# Patient Record
Sex: Female | Born: 1945 | Race: White | Hispanic: No | Marital: Married | Smoking: Never smoker
Health system: Southern US, Community
[De-identification: ages and names within clinical notes are randomized; demographics above are authoritative.]

## PROBLEM LIST (undated history)

## (undated) DIAGNOSIS — R51 Headache: Secondary | ICD-10-CM

## (undated) DIAGNOSIS — R519 Headache, unspecified: Secondary | ICD-10-CM

## (undated) DIAGNOSIS — Z9289 Personal history of other medical treatment: Secondary | ICD-10-CM

## (undated) DIAGNOSIS — M1990 Unspecified osteoarthritis, unspecified site: Secondary | ICD-10-CM

## (undated) DIAGNOSIS — R011 Cardiac murmur, unspecified: Secondary | ICD-10-CM

## (undated) DIAGNOSIS — F419 Anxiety disorder, unspecified: Secondary | ICD-10-CM

## (undated) DIAGNOSIS — D649 Anemia, unspecified: Secondary | ICD-10-CM

## (undated) HISTORY — PX: TONSILLECTOMY: SUR1361

## (undated) HISTORY — PX: COSMETIC SURGERY: SHX468

---

## 2011-01-22 ENCOUNTER — Ambulatory Visit
Admission: RE | Admit: 2011-01-22 | Discharge: 2011-01-22 | Disposition: A | Discharge status: Home / Self Care | Payer: BC Managed Care – PPO | Source: Ambulatory Visit | Attending: Family Medicine | Admitting: Family Medicine

## 2011-01-22 ENCOUNTER — Other Ambulatory Visit: Payer: Self-pay | Admitting: Family Medicine

## 2011-01-22 DIAGNOSIS — R52 Pain, unspecified: Secondary | ICD-10-CM

## 2012-06-15 ENCOUNTER — Ambulatory Visit
Admission: RE | Admit: 2012-06-15 | Discharge: 2012-06-15 | Disposition: A | Discharge status: Home / Self Care | Payer: BC Managed Care – PPO | Source: Ambulatory Visit | Attending: Family Medicine | Admitting: Family Medicine

## 2012-06-15 ENCOUNTER — Other Ambulatory Visit: Payer: Self-pay | Admitting: Family Medicine

## 2012-06-15 DIAGNOSIS — R05 Cough: Secondary | ICD-10-CM

## 2012-06-15 DIAGNOSIS — R509 Fever, unspecified: Secondary | ICD-10-CM

## 2015-05-25 ENCOUNTER — Other Ambulatory Visit: Payer: Self-pay | Admitting: Family Medicine

## 2015-05-25 DIAGNOSIS — Z1231 Encounter for screening mammogram for malignant neoplasm of breast: Secondary | ICD-10-CM

## 2015-05-25 DIAGNOSIS — E2839 Other primary ovarian failure: Secondary | ICD-10-CM

## 2015-06-19 ENCOUNTER — Ambulatory Visit
Admission: RE | Admit: 2015-06-19 | Discharge: 2015-06-19 | Disposition: A | Discharge status: Home / Self Care | Payer: BC Managed Care – PPO | Source: Ambulatory Visit | Attending: Family Medicine | Admitting: Family Medicine

## 2015-06-19 DIAGNOSIS — Z1231 Encounter for screening mammogram for malignant neoplasm of breast: Secondary | ICD-10-CM

## 2015-06-19 DIAGNOSIS — E2839 Other primary ovarian failure: Secondary | ICD-10-CM

## 2016-07-30 ENCOUNTER — Other Ambulatory Visit: Payer: Self-pay | Admitting: Family Medicine

## 2016-07-30 DIAGNOSIS — Z1231 Encounter for screening mammogram for malignant neoplasm of breast: Secondary | ICD-10-CM

## 2016-08-29 ENCOUNTER — Ambulatory Visit
Admission: RE | Admit: 2016-08-29 | Discharge: 2016-08-29 | Disposition: A | Discharge status: Home / Self Care | Payer: BC Managed Care – PPO | Source: Ambulatory Visit | Attending: Family Medicine | Admitting: Family Medicine

## 2016-08-29 DIAGNOSIS — Z1231 Encounter for screening mammogram for malignant neoplasm of breast: Secondary | ICD-10-CM

## 2016-12-20 ENCOUNTER — Encounter (HOSPITAL_COMMUNITY): Payer: Self-pay | Admitting: Emergency Medicine

## 2016-12-20 ENCOUNTER — Emergency Department (HOSPITAL_COMMUNITY): Payer: BC Managed Care – PPO

## 2016-12-20 ENCOUNTER — Inpatient Hospital Stay (HOSPITAL_COMMUNITY)
Admission: EM | Admit: 2016-12-20 | Discharge: 2016-12-25 | DRG: 552 | Disposition: A | Discharge status: Skilled Nursing Facility | Payer: BC Managed Care – PPO | Attending: General Surgery | Admitting: General Surgery

## 2016-12-20 DIAGNOSIS — Y92410 Unspecified street and highway as the place of occurrence of the external cause: Secondary | ICD-10-CM

## 2016-12-20 DIAGNOSIS — S7001XA Contusion of right hip, initial encounter: Secondary | ICD-10-CM | POA: Diagnosis present

## 2016-12-20 DIAGNOSIS — M1990 Unspecified osteoarthritis, unspecified site: Secondary | ICD-10-CM | POA: Diagnosis present

## 2016-12-20 DIAGNOSIS — S92331A Displaced fracture of third metatarsal bone, right foot, initial encounter for closed fracture: Secondary | ICD-10-CM | POA: Diagnosis not present

## 2016-12-20 DIAGNOSIS — S7002XA Contusion of left hip, initial encounter: Secondary | ICD-10-CM | POA: Diagnosis present

## 2016-12-20 DIAGNOSIS — Z885 Allergy status to narcotic agent status: Secondary | ICD-10-CM | POA: Diagnosis not present

## 2016-12-20 DIAGNOSIS — M542 Cervicalgia: Secondary | ICD-10-CM | POA: Diagnosis not present

## 2016-12-20 DIAGNOSIS — S92321A Displaced fracture of second metatarsal bone, right foot, initial encounter for closed fracture: Secondary | ICD-10-CM | POA: Diagnosis not present

## 2016-12-20 DIAGNOSIS — S22019A Unspecified fracture of first thoracic vertebra, initial encounter for closed fracture: Secondary | ICD-10-CM | POA: Diagnosis present

## 2016-12-20 DIAGNOSIS — S22018A Other fracture of first thoracic vertebra, initial encounter for closed fracture: Secondary | ICD-10-CM

## 2016-12-20 DIAGNOSIS — S12600A Unspecified displaced fracture of seventh cervical vertebra, initial encounter for closed fracture: Principal | ICD-10-CM | POA: Diagnosis present

## 2016-12-20 DIAGNOSIS — S2220XA Unspecified fracture of sternum, initial encounter for closed fracture: Secondary | ICD-10-CM | POA: Diagnosis present

## 2016-12-20 DIAGNOSIS — Z888 Allergy status to other drugs, medicaments and biological substances status: Secondary | ICD-10-CM

## 2016-12-20 DIAGNOSIS — S12601A Unspecified nondisplaced fracture of seventh cervical vertebra, initial encounter for closed fracture: Secondary | ICD-10-CM

## 2016-12-20 HISTORY — DX: Unspecified osteoarthritis, unspecified site: M19.90

## 2016-12-20 HISTORY — DX: Anemia, unspecified: D64.9

## 2016-12-20 HISTORY — DX: Cardiac murmur, unspecified: R01.1

## 2016-12-20 HISTORY — DX: Headache: R51

## 2016-12-20 HISTORY — DX: Anxiety disorder, unspecified: F41.9

## 2016-12-20 HISTORY — DX: Personal history of other medical treatment: Z92.89

## 2016-12-20 HISTORY — DX: Headache, unspecified: R51.9

## 2016-12-20 LAB — I-STAT CHEM 8, ED
BUN: 14 mg/dL (ref 6–20)
CHLORIDE: 104 mmol/L (ref 101–111)
Calcium, Ion: 1.15 mmol/L (ref 1.15–1.40)
Creatinine, Ser: 0.7 mg/dL (ref 0.44–1.00)
GLUCOSE: 200 mg/dL — AB (ref 65–99)
HEMATOCRIT: 37 % (ref 36.0–46.0)
Hemoglobin: 12.6 g/dL (ref 12.0–15.0)
POTASSIUM: 3.6 mmol/L (ref 3.5–5.1)
Sodium: 140 mmol/L (ref 135–145)
TCO2: 25 mmol/L (ref 0–100)

## 2016-12-20 LAB — COMPREHENSIVE METABOLIC PANEL
ALT: 28 U/L (ref 14–54)
AST: 47 U/L — ABNORMAL HIGH (ref 15–41)
Albumin: 3.6 g/dL (ref 3.5–5.0)
Alkaline Phosphatase: 70 U/L (ref 38–126)
Anion gap: 12 (ref 5–15)
BILIRUBIN TOTAL: 0.7 mg/dL (ref 0.3–1.2)
BUN: 13 mg/dL (ref 6–20)
CO2: 24 mmol/L (ref 22–32)
Calcium: 9.5 mg/dL (ref 8.9–10.3)
Chloride: 104 mmol/L (ref 101–111)
Creatinine, Ser: 0.74 mg/dL (ref 0.44–1.00)
Glucose, Bld: 203 mg/dL — ABNORMAL HIGH (ref 65–99)
Potassium: 3.6 mmol/L (ref 3.5–5.1)
Sodium: 140 mmol/L (ref 135–145)
TOTAL PROTEIN: 6.3 g/dL — AB (ref 6.5–8.1)

## 2016-12-20 LAB — CBC WITH DIFFERENTIAL/PLATELET
BASOS ABS: 0 10*3/uL (ref 0.0–0.1)
Basophils Relative: 0 %
Eosinophils Absolute: 0.1 10*3/uL (ref 0.0–0.7)
Eosinophils Relative: 1 %
HEMATOCRIT: 36.5 % (ref 36.0–46.0)
Hemoglobin: 12 g/dL (ref 12.0–15.0)
LYMPHS ABS: 1.3 10*3/uL (ref 0.7–4.0)
LYMPHS PCT: 11 %
MCH: 31.5 pg (ref 26.0–34.0)
MCHC: 32.9 g/dL (ref 30.0–36.0)
MCV: 95.8 fL (ref 78.0–100.0)
MONO ABS: 0.8 10*3/uL (ref 0.1–1.0)
Monocytes Relative: 7 %
NEUTROS ABS: 10 10*3/uL — AB (ref 1.7–7.7)
Neutrophils Relative %: 81 %
PLATELETS: 291 10*3/uL (ref 150–400)
RBC: 3.81 MIL/uL — ABNORMAL LOW (ref 3.87–5.11)
RDW: 14.3 % (ref 11.5–15.5)
WBC: 12.3 10*3/uL — ABNORMAL HIGH (ref 4.0–10.5)

## 2016-12-20 LAB — I-STAT TROPONIN, ED: TROPONIN I, POC: 0 ng/mL (ref 0.00–0.08)

## 2016-12-20 LAB — LIPASE, BLOOD: LIPASE: 20 U/L (ref 11–51)

## 2016-12-20 MED ORDER — ONDANSETRON HCL 4 MG/2ML IJ SOLN
4.0000 mg | Freq: Once | INTRAMUSCULAR | Status: AC
  Administered 2016-12-20: 4 mg via INTRAVENOUS
  Filled 2016-12-20: qty 2

## 2016-12-20 MED ORDER — SODIUM CHLORIDE 0.9 % IV BOLUS (SEPSIS)
1000.0000 mL | Freq: Once | INTRAVENOUS | Status: AC
  Administered 2016-12-20: 1000 mL via INTRAVENOUS

## 2016-12-20 MED ORDER — ENOXAPARIN SODIUM 40 MG/0.4ML ~~LOC~~ SOLN
40.0000 mg | SUBCUTANEOUS | Status: DC
  Administered 2016-12-21 – 2016-12-25 (×5): 40 mg via SUBCUTANEOUS
  Filled 2016-12-20 (×5): qty 0.4

## 2016-12-20 MED ORDER — IOPAMIDOL (ISOVUE-300) INJECTION 61%
INTRAVENOUS | Status: AC
  Administered 2016-12-20: 100 mL
  Filled 2016-12-20: qty 100

## 2016-12-20 MED ORDER — DIPHENHYDRAMINE HCL 25 MG PO TABS
25.0000 mg | ORAL_TABLET | Freq: Four times a day (QID) | ORAL | Status: DC | PRN
  Filled 2016-12-20: qty 1

## 2016-12-20 MED ORDER — KCL IN DEXTROSE-NACL 20-5-0.45 MEQ/L-%-% IV SOLN
INTRAVENOUS | Status: DC
  Administered 2016-12-20 – 2016-12-21 (×2): via INTRAVENOUS
  Filled 2016-12-20 (×2): qty 1000

## 2016-12-20 MED ORDER — ONDANSETRON HCL 4 MG PO TABS: 4.0000 mg | ORAL_TABLET | Freq: Four times a day (QID) | ORAL | Status: DC | PRN

## 2016-12-20 MED ORDER — METHOTREXATE 2.5 MG PO TABS
10.0000 mg | ORAL_TABLET | ORAL | Status: DC
  Filled 2016-12-20: qty 4

## 2016-12-20 MED ORDER — HYDROMORPHONE HCL 1 MG/ML IJ SOLN
0.5000 mg | Freq: Once | INTRAMUSCULAR | Status: AC
  Administered 2016-12-20: 0.5 mg via INTRAVENOUS
  Filled 2016-12-20: qty 1

## 2016-12-20 MED ORDER — METHOCARBAMOL 1000 MG/10ML IJ SOLN
1000.0000 mg | Freq: Three times a day (TID) | INTRAVENOUS | Status: DC | PRN
  Administered 2016-12-20 – 2016-12-23 (×6): 1000 mg via INTRAVENOUS
  Filled 2016-12-20 (×13): qty 10

## 2016-12-20 MED ORDER — OXYCODONE HCL 5 MG PO TABS
10.0000 mg | ORAL_TABLET | ORAL | Status: DC | PRN
  Administered 2016-12-22 – 2016-12-24 (×5): 10 mg via ORAL
  Filled 2016-12-20 (×7): qty 2

## 2016-12-20 MED ORDER — ONDANSETRON HCL 4 MG/2ML IJ SOLN
4.0000 mg | Freq: Four times a day (QID) | INTRAMUSCULAR | Status: DC | PRN
  Administered 2016-12-20 – 2016-12-21 (×6): 4 mg via INTRAVENOUS
  Filled 2016-12-20 (×6): qty 2

## 2016-12-20 MED ORDER — PANTOPRAZOLE SODIUM 40 MG PO TBEC
40.0000 mg | DELAYED_RELEASE_TABLET | Freq: Every day | ORAL | Status: DC
  Administered 2016-12-22 – 2016-12-25 (×4): 40 mg via ORAL
  Filled 2016-12-20 (×4): qty 1

## 2016-12-20 MED ORDER — PANTOPRAZOLE SODIUM 40 MG IV SOLR
40.0000 mg | Freq: Every day | INTRAVENOUS | Status: DC
  Administered 2016-12-20 – 2016-12-21 (×2): 40 mg via INTRAVENOUS
  Filled 2016-12-20 (×2): qty 40

## 2016-12-20 MED ORDER — OXYCODONE HCL 5 MG PO TABS
5.0000 mg | ORAL_TABLET | ORAL | Status: DC | PRN
  Administered 2016-12-24: 5 mg via ORAL

## 2016-12-20 MED ORDER — HYDROMORPHONE HCL 1 MG/ML IJ SOLN
0.5000 mg | INTRAMUSCULAR | Status: DC | PRN
  Administered 2016-12-20 – 2016-12-23 (×15): 0.5 mg via INTRAVENOUS
  Filled 2016-12-20 (×15): qty 1

## 2016-12-20 MED ORDER — TRAMADOL HCL 50 MG PO TABS: 50.0000 mg | ORAL_TABLET | Freq: Four times a day (QID) | ORAL | Status: DC | PRN

## 2016-12-20 NOTE — Progress Notes (Signed)
Orthopedic Tech Progress Note Patient Details:  Judy Waters 1946/02/04 [id number]  Ortho Devices Type of Ortho Device: CAM walker Ortho Device/Splint Location: rle Ortho Device/Splint Interventions: Application   [name], [name] 12/20/2016, 4:51 PM

## 2016-12-20 NOTE — ED Notes (Signed)
Returned from ct scan 
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
## 2016-12-20 NOTE — Progress Notes (Signed)
Agree with previous RN's assessment. 
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
## 2016-12-20 NOTE — Progress Notes (Signed)
Orthopedic Tech Progress Note Patient Details:  Judy Waters [birth year] [id number]  Ortho Devices Type of Ortho Device: Ace wrap, Post (short leg) splint Ortho Device/Splint Location: rle Ortho Device/Splint Interventions: Application   Julius Matus 12/20/2016, 11:10 AM

## 2016-12-20 NOTE — ED Notes (Signed)
Patient transported to CT 
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
## 2016-12-20 NOTE — ED Notes (Signed)
Paged Ortho to place in splint

## 2016-12-21 LAB — BASIC METABOLIC PANEL
Anion gap: 6 (ref 5–15)
BUN: 7 mg/dL (ref 6–20)
CHLORIDE: 105 mmol/L (ref 101–111)
CO2: 26 mmol/L (ref 22–32)
Calcium: 8.7 mg/dL — ABNORMAL LOW (ref 8.9–10.3)
Creatinine, Ser: 0.61 mg/dL (ref 0.44–1.00)
GFR calc Af Amer: >60 mL/min (ref >60)
GLUCOSE: 120 mg/dL — AB (ref 65–99)
POTASSIUM: 3.8 mmol/L (ref 3.5–5.1)
Sodium: 137 mmol/L (ref 135–145)

## 2016-12-21 LAB — CBC
HEMATOCRIT: 31.3 % — AB (ref 36.0–46.0)
Hemoglobin: 10.2 g/dL — ABNORMAL LOW (ref 12.0–15.0)
MCH: 31.1 pg (ref 26.0–34.0)
MCHC: 32.6 g/dL (ref 30.0–36.0)
MCV: 95.4 fL (ref 78.0–100.0)
PLATELETS: 247 10*3/uL (ref 150–400)
RBC: 3.28 MIL/uL — ABNORMAL LOW (ref 3.87–5.11)
RDW: 14.4 % (ref 11.5–15.5)
WBC: 8.4 10*3/uL (ref 4.0–10.5)

## 2016-12-21 MED ORDER — SODIUM CHLORIDE 0.9% FLUSH
3.0000 mL | Freq: Two times a day (BID) | INTRAVENOUS | Status: DC
  Administered 2016-12-23 – 2016-12-24 (×2): 3 mL via INTRAVENOUS

## 2016-12-21 MED ORDER — SODIUM CHLORIDE 0.9 % IV SOLN: 250.0000 mL | INTRAVENOUS | Status: DC | PRN

## 2016-12-21 MED ORDER — SODIUM CHLORIDE 0.9% FLUSH: 3.0000 mL | INTRAVENOUS | Status: DC | PRN
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
## 2016-12-21 NOTE — Progress Notes (Signed)
Pt refuses to take PO pain meds for she scared to get upset stomach. Refuses to use ice pack for ice pack is too bulky and heavy. Offered hot pack for some pain relief but refuses because "its too small" and "it does not work". Pt only takes IV dilaudid pain medicine if PRN zofran is available. Tried to reposition pt side to side for comfort but refused to turn on her right side because of the glare/light from the computer and window gives her headache. Gave robaxin IV PRN for muscle pains. Will continue to monitor pt.

## 2016-12-22 MED ORDER — DOCUSATE SODIUM 100 MG PO CAPS
100.0000 mg | ORAL_CAPSULE | Freq: Two times a day (BID) | ORAL | Status: DC
  Administered 2016-12-22 – 2016-12-25 (×7): 100 mg via ORAL
  Filled 2016-12-22 (×7): qty 1

## 2016-12-22 MED ORDER — ONDANSETRON HCL 4 MG PO TABS
4.0000 mg | ORAL_TABLET | ORAL | Status: DC | PRN
  Administered 2016-12-22 – 2016-12-24 (×3): 4 mg via ORAL
  Filled 2016-12-22 (×3): qty 1

## 2016-12-22 MED ORDER — TRAMADOL HCL 50 MG PO TABS
50.0000 mg | ORAL_TABLET | Freq: Two times a day (BID) | ORAL | Status: DC
  Administered 2016-12-22 – 2016-12-25 (×6): 50 mg via ORAL
  Filled 2016-12-22 (×8): qty 1

## 2016-12-22 MED ORDER — ACETAMINOPHEN 325 MG PO TABS: 650.0000 mg | ORAL_TABLET | Freq: Four times a day (QID) | ORAL | Status: DC | PRN

## 2016-12-22 MED ORDER — ONDANSETRON HCL 4 MG/2ML IJ SOLN
4.0000 mg | INTRAMUSCULAR | Status: DC | PRN
  Administered 2016-12-22 – 2016-12-24 (×7): 4 mg via INTRAVENOUS
  Filled 2016-12-22 (×7): qty 2
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
## 2016-12-22 NOTE — Clinical Social Work Placement (Signed)
   CLINICAL SOCIAL WORK PLACEMENT  NOTE  Date:  12/22/2016  Patient Details  Name: Baltazar Apoamela Kott MRN: [id number] Date of Birth: 1946-04-19  Clinical Social Work is seeking post-discharge placement for this patient at the Skilled  Nursing Facility level of care (*CSW will initial, date and re-position this form in  chart as items are completed):  No   Patient/family provided with [facility] Clinical Social Work Department's list of facilities offering this level of care within the geographic area requested by the patient (or if unable, by the patient's family).  Yes   Patient/family informed of their freedom to choose among providers that offer the needed level of care, that participate in Medicare, Medicaid or managed care program needed by the patient, have an available bed and are willing to accept the patient.  Yes   Patient/family informed of [facility]'s ownership interest in [facility] and [facility], as well as of the fact that they are under no obligation to receive care at these facilities.  PASRR submitted to EDS on       PASRR number received on       Existing PASRR number confirmed on       FL2 transmitted to all facilities in geographic area requested by pt/family on       FL2 transmitted to all facilities within larger geographic area on       Patient informed that his/her managed care company has contracts with or will negotiate with certain facilities, including the following:            Patient/family informed of bed offers received.  Patient chooses bed at       Physician recommends and patient chooses bed at      Patient to be transferred to   on  .  Patient to be transferred to facility by       Patient family notified on   of transfer.  Name of family member notified:        PHYSICIAN Please sign FL2     Additional Comment:    _______________________________________________ Althea CharonAshley C Lydia Meng, LCSW 12/22/2016, 4:26 PM
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
## 2016-12-22 NOTE — Progress Notes (Addendum)
IV became occluded after IV pain/muscle relaxer. IV team notified for new IV  Site.. PO pain meds given in the mean time. Will continue the muscle relaxer once we have IV access.
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
## 2016-12-23 MED ORDER — PROMETHAZINE HCL 25 MG PO TABS
12.5000 mg | ORAL_TABLET | Freq: Four times a day (QID) | ORAL | Status: DC | PRN
  Administered 2016-12-23 (×2): 12.5 mg via ORAL
  Filled 2016-12-23 (×2): qty 1

## 2016-12-23 MED ORDER — METHOCARBAMOL 500 MG PO TABS: 1000.0000 mg | ORAL_TABLET | Freq: Three times a day (TID) | ORAL | Status: DC | PRN

## 2016-12-23 MED ORDER — ACETAMINOPHEN 500 MG PO TABS
500.0000 mg | ORAL_TABLET | Freq: Four times a day (QID) | ORAL | Status: DC
  Administered 2016-12-23 – 2016-12-25 (×9): 500 mg via ORAL
  Filled 2016-12-23 (×9): qty 1

## 2016-12-23 MED ORDER — METHOCARBAMOL 750 MG PO TABS
750.0000 mg | ORAL_TABLET | Freq: Three times a day (TID) | ORAL | Status: DC | PRN
  Administered 2016-12-23 – 2016-12-25 (×4): 750 mg via ORAL
  Filled 2016-12-23 (×4): qty 1

## 2016-12-23 MED ORDER — IBUPROFEN 400 MG PO TABS
400.0000 mg | ORAL_TABLET | Freq: Four times a day (QID) | ORAL | Status: DC
  Administered 2016-12-23 – 2016-12-25 (×9): 400 mg via ORAL
  Filled 2016-12-23 (×9): qty 1

## 2016-12-23 MED ORDER — HYDROMORPHONE HCL 1 MG/ML IJ SOLN
0.5000 mg | INTRAMUSCULAR | Status: DC | PRN
  Administered 2016-12-23 – 2016-12-24 (×5): 0.5 mg via INTRAVENOUS
  Filled 2016-12-23 (×5): qty 1
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
## 2016-12-23 NOTE — Progress Notes (Signed)
Mobility Tech check on patient twice and each time the patient was in pain, RN recommend that the mobility tech just come back tomorrow.  Vashti Heyamekia Jameyah Fen[place]ald, [place] [phone]
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
## 2016-12-23 NOTE — Clinical Social Work Note (Signed)
Clinical Social Worker continuing to follow patient and family for support and discharge planning needs.  CSW spoke with patient at bedside who requests placement at [facility] - CSW relayed to patient that unfortunately facility is not in network with insurance.  Patient is agreeable to [facility] as it is also close to home.  CSW has initiated referral and will follow up with patient once bed offer is available.  CSW remains available for support and to facilitate patient discharge needs.  Judy GoldsJesse Lacheryl Waters, [place] [phone]
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
## 2016-12-24 LAB — CBC
HCT: 31.3 % — ABNORMAL LOW (ref 36.0–46.0)
Hemoglobin: 10.5 g/dL — ABNORMAL LOW (ref 12.0–15.0)
MCH: 32.3 pg (ref 26.0–34.0)
MCHC: 33.5 g/dL (ref 30.0–36.0)
MCV: 96.3 fL (ref 78.0–100.0)
PLATELETS: 256 10*3/uL (ref 150–400)
RBC: 3.25 MIL/uL — AB (ref 3.87–5.11)
RDW: 14.7 % (ref 11.5–15.5)
WBC: 6.6 10*3/uL (ref 4.0–10.5)

## 2016-12-24 MED ORDER — PROMETHAZINE HCL 12.5 MG PO TABS: 12.5000 mg | ORAL_TABLET | Freq: Four times a day (QID) | ORAL | 0 refills | Status: AC | PRN

## 2016-12-24 MED ORDER — PANTOPRAZOLE SODIUM 40 MG PO TBEC: 40.0000 mg | DELAYED_RELEASE_TABLET | Freq: Every day | ORAL | Status: AC

## 2016-12-24 MED ORDER — TRAMADOL HCL 50 MG PO TABS: 50.0000 mg | ORAL_TABLET | Freq: Two times a day (BID) | ORAL | 0 refills | Status: AC

## 2016-12-24 MED ORDER — IBUPROFEN 400 MG PO TABS: 400.0000 mg | ORAL_TABLET | Freq: Four times a day (QID) | ORAL | 0 refills | Status: AC

## 2016-12-24 MED ORDER — ONDANSETRON HCL 4 MG PO TABS: 4.0000 mg | ORAL_TABLET | ORAL | 0 refills | Status: AC | PRN

## 2016-12-24 MED ORDER — ACETAMINOPHEN 500 MG PO TABS: 500.0000 mg | ORAL_TABLET | Freq: Four times a day (QID) | ORAL | 0 refills | Status: AC | PRN

## 2016-12-24 MED ORDER — OXYCODONE HCL 5 MG PO TABS: 5.0000 mg | ORAL_TABLET | ORAL | 0 refills | Status: AC | PRN

## 2016-12-24 MED ORDER — METHOCARBAMOL 750 MG PO TABS: 750.0000 mg | ORAL_TABLET | Freq: Three times a day (TID) | ORAL | 0 refills | Status: AC | PRN

## 2016-12-24 MED ORDER — DOCUSATE SODIUM 100 MG PO CAPS: 100.0000 mg | ORAL_CAPSULE | Freq: Two times a day (BID) | ORAL | 0 refills | Status: AC
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
## 2016-12-24 NOTE — Clinical Social Work Note (Signed)
Clinical Social Worker continuing to follow patient and family for support and discharge planning needs.  CSW spoke with patient who is agreeable with bed offer from [facility].  CSW spoke with facility who states that they have submitted for insurance authorization and will likely hear back by the end of today.  Per PA, plan is for patient discharge tomorrow to facility.  CSW spoke with patient who is aware and will have family bring necessary things to the hospital this evening.  CSW remains available for support and to facilitate patient discharge needs.  Macario GoldsJesse Emonie Espericueta, [place] [phone]
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
## 2016-12-25 NOTE — Progress Notes (Signed)
Report called to [facility]. Gave RN my number for any questions/concerns.

## 2016-12-25 NOTE — Progress Notes (Signed)
Pt. Dressed and ready to go. Boot on Right foot. C-collar on. Pt. Belongings gathered and sent with pt. IV out. D/c papers and prescriptions sent with pt.  Pt. [facility] to [facility].

## 2016-12-25 NOTE — Clinical Social Work Placement (Signed)
   CLINICAL SOCIAL WORK PLACEMENT  NOTE  Date:  12/25/2016  Patient Details  Name: Judy Waters MRN: [id number] Date of Birth: 24-Sep-1946  Clinical Social Work is seeking post-discharge placement for this patient at the Skilled  Nursing Facility level of care (*CSW will initial, date and re-position this form in  chart as items are completed):  No   Patient/family provided with [facility] Clinical Social Work Department's list of facilities offering this level of care within the geographic area requested by the patient (or if unable, by the patient's family).  Yes   Patient/family informed of their freedom to choose among providers that offer the needed level of care, that participate in Medicare, Medicaid or managed care program needed by the patient, have an available bed and are willing to accept the patient.  Yes   Patient/family informed of [facility]'s ownership interest in [facility] and [facility], as well as of the fact that they are under no obligation to receive care at these facilities.  PASRR submitted to EDS on       PASRR number received on 12/22/16     Existing PASRR number confirmed on       FL2 transmitted to all facilities in geographic area requested by pt/family on 12/22/16     FL2 transmitted to all facilities within larger geographic area on       Patient informed that his/her managed care company has contracts with or will negotiate with certain facilities, including the following:        Yes   Patient/family informed of bed offers received.  Patient [facility]     Physician recommends and patient chooses bed at      [facility] 12[facility].  Patient to be transferred to facility by [facility]     Patient family notified on 12/25/16 of transfer.  Name of family member notified:  Birdena Jubileeavid Nile     PHYSICIAN       Additional Comment:  Pt [facility]. Pt is aware and agreeable to d/c plan. Pt declined CSW updating family. Pt will call son herself. CSW [facility] and confirmed room and report with Olegario MessierKathy (admissions) at [facility]. CSW provided RN with room and report and put in treatment team sticky note. CSW arranged transportation with [facility] for11:30am pickup, as requested by RN. CSW signing off, as no further needs identified.   _______________________________________________ Dominic PeaJeneya G Yousuf Ager, LCSW 12/25/2016, 10:25 AM

## 2018-04-21 ENCOUNTER — Other Ambulatory Visit: Payer: Self-pay | Admitting: Family Medicine

## 2018-04-21 DIAGNOSIS — Z1231 Encounter for screening mammogram for malignant neoplasm of breast: Secondary | ICD-10-CM

## 2018-07-09 ENCOUNTER — Ambulatory Visit
Admission: RE | Admit: 2018-07-09 | Discharge: 2018-07-09 | Disposition: A | Discharge status: Home / Self Care | Payer: BC Managed Care – PPO | Source: Ambulatory Visit | Attending: Family Medicine | Admitting: Family Medicine

## 2018-07-09 DIAGNOSIS — Z1231 Encounter for screening mammogram for malignant neoplasm of breast: Secondary | ICD-10-CM

## 2019-05-31 ENCOUNTER — Other Ambulatory Visit: Payer: Self-pay | Admitting: Family Medicine

## 2019-05-31 DIAGNOSIS — Z1231 Encounter for screening mammogram for malignant neoplasm of breast: Secondary | ICD-10-CM

## 2019-07-14 ENCOUNTER — Ambulatory Visit: Payer: BC Managed Care – PPO

## 2019-07-19 ENCOUNTER — Other Ambulatory Visit: Payer: Self-pay

## 2019-07-19 ENCOUNTER — Ambulatory Visit
Admission: RE | Admit: 2019-07-19 | Discharge: 2019-07-19 | Disposition: A | Discharge status: Home / Self Care | Payer: BC Managed Care – PPO | Source: Ambulatory Visit | Attending: Family Medicine | Admitting: Family Medicine

## 2019-07-19 DIAGNOSIS — Z1231 Encounter for screening mammogram for malignant neoplasm of breast: Secondary | ICD-10-CM

## 2020-03-31 ENCOUNTER — Other Ambulatory Visit: Payer: Self-pay | Admitting: Family Medicine

## 2020-03-31 DIAGNOSIS — Z1231 Encounter for screening mammogram for malignant neoplasm of breast: Secondary | ICD-10-CM

## 2020-03-31 DIAGNOSIS — M810 Age-related osteoporosis without current pathological fracture: Secondary | ICD-10-CM

## 2020-07-19 ENCOUNTER — Ambulatory Visit
Admission: RE | Admit: 2020-07-19 | Discharge: 2020-07-19 | Disposition: A | Discharge status: Home / Self Care | Payer: BC Managed Care – PPO | Source: Ambulatory Visit | Attending: Family Medicine | Admitting: Family Medicine

## 2020-07-19 ENCOUNTER — Other Ambulatory Visit: Payer: Self-pay

## 2020-07-19 DIAGNOSIS — Z1231 Encounter for screening mammogram for malignant neoplasm of breast: Secondary | ICD-10-CM

## 2020-07-19 DIAGNOSIS — M810 Age-related osteoporosis without current pathological fracture: Secondary | ICD-10-CM

## 2022-04-03 IMAGING — MG DIGITAL SCREENING BILAT W/ TOMO W/ CAD
6 of 10 series · 6 of 30 positions shown · non-contrast
Comparison: Previous exam(s).

CLINICAL DATA: Screening.

EXAM:
DIGITAL SCREENING BILATERAL MAMMOGRAM WITH TOMO AND CAD

[L MLO synth-2D (1 of 2)]
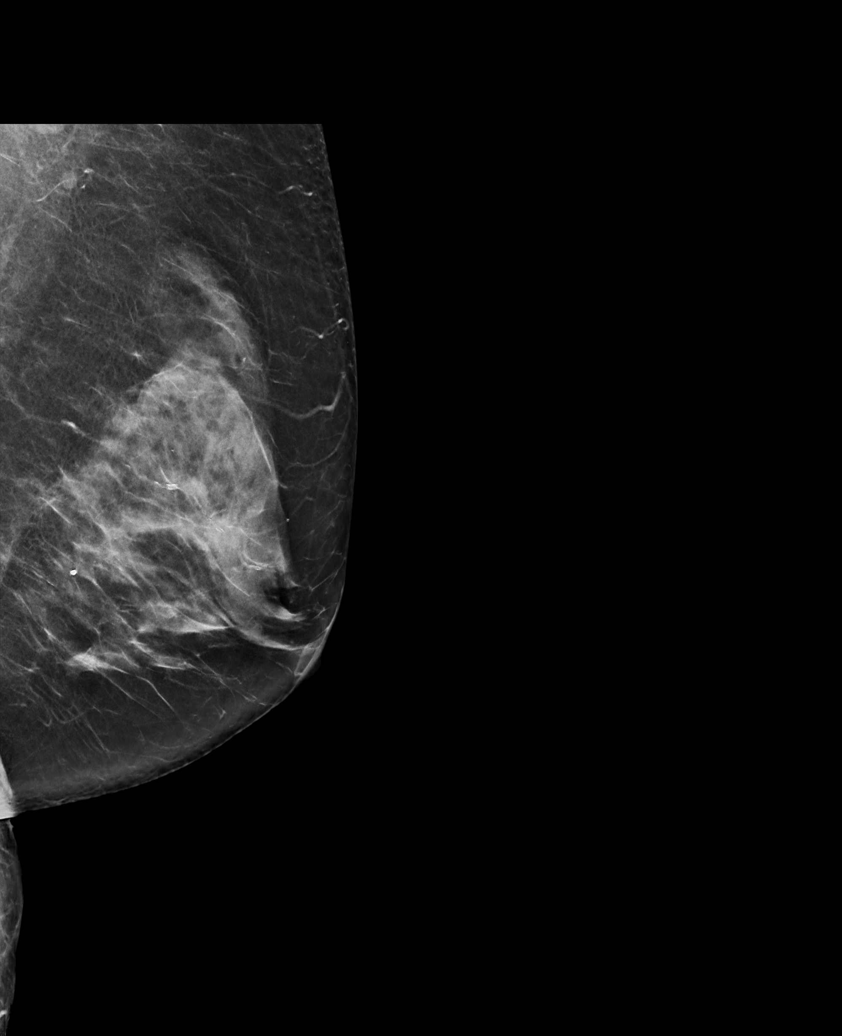

[L CC synth-2D]
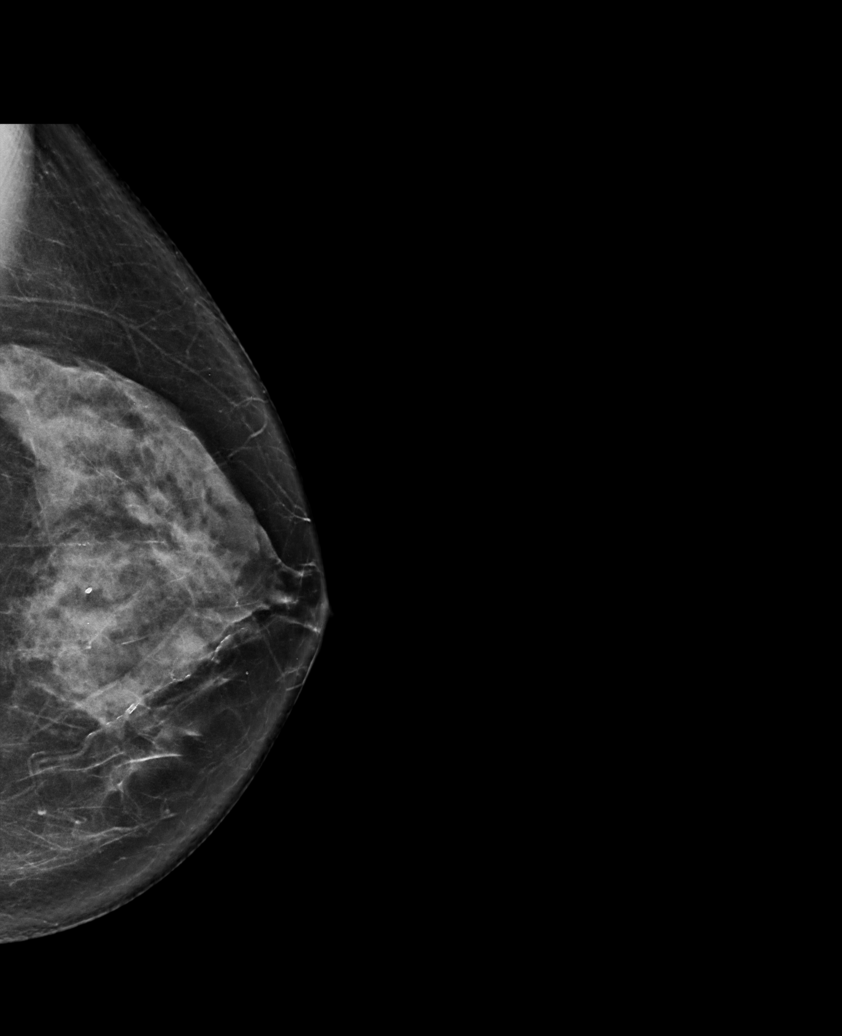

[L MLO synth-2D (2 of 2)]
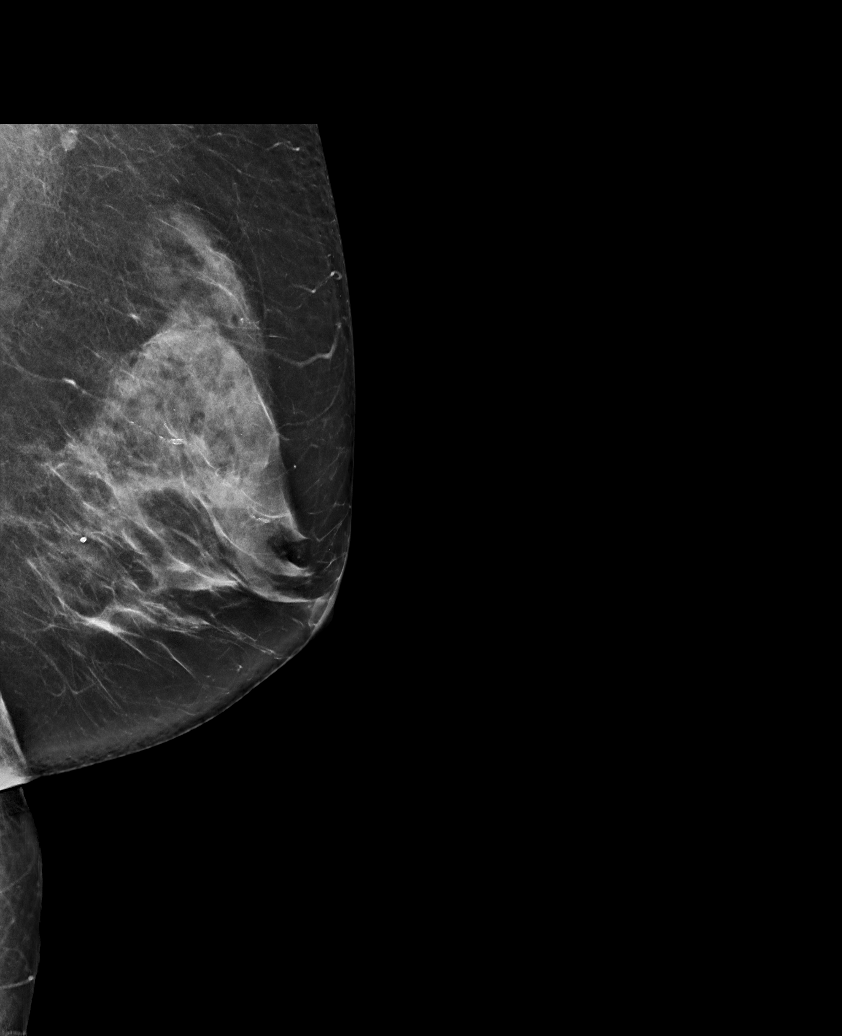

[R MLO synth-2D]
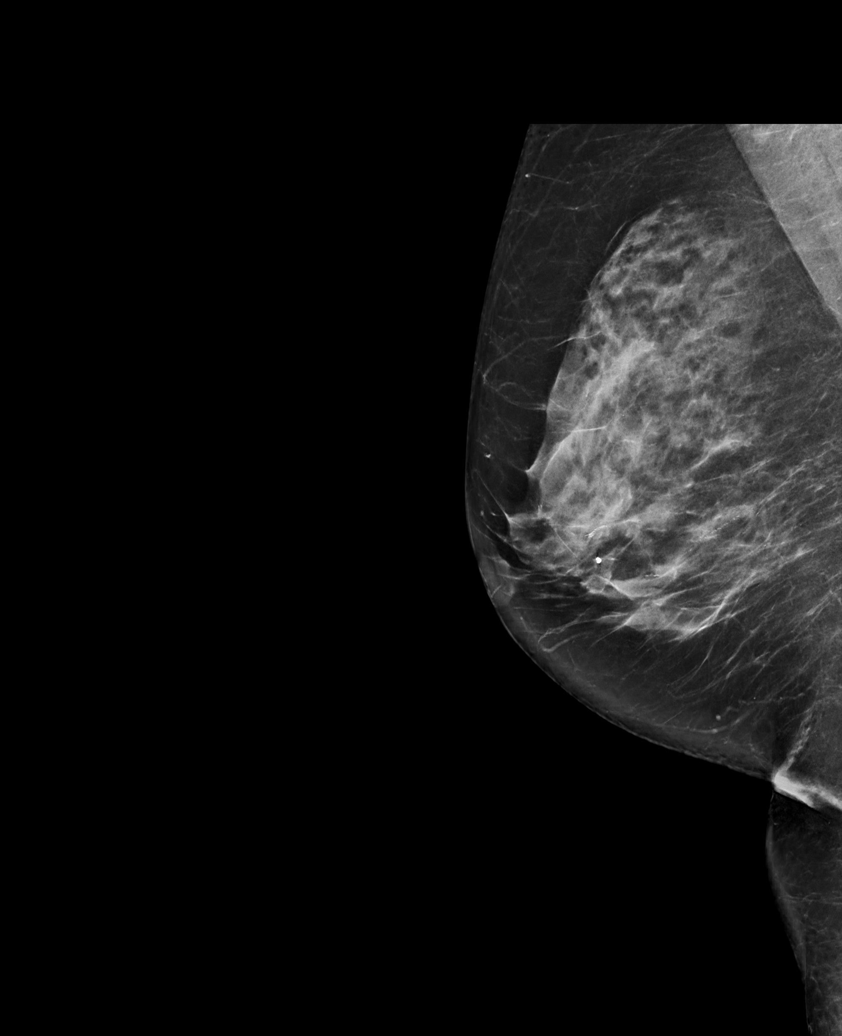

[R CC synth-2D]
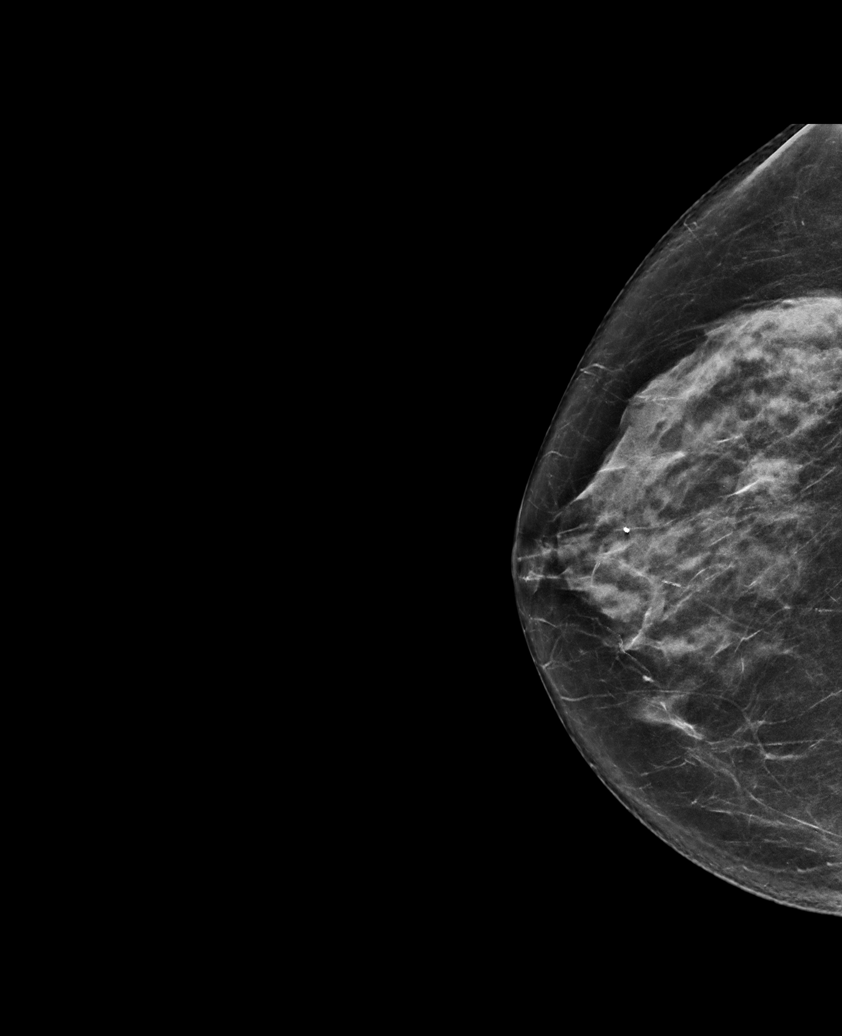

[R MLO tomo · tomo slice 41/82.0]
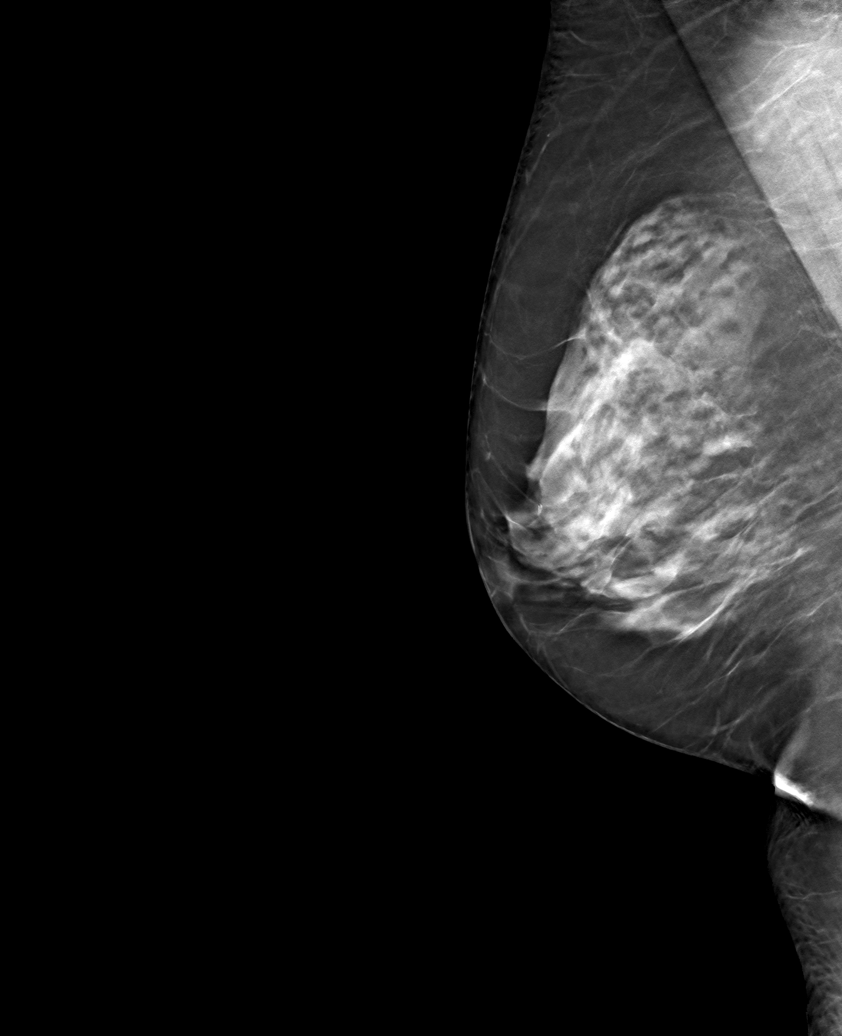

[6 of 30 positions shown; findings below may reference images not displayed]

ACR Breast Density Category c: The breast tissue is heterogeneously
dense, which may obscure small masses.
FINDINGS: There are no findings suspicious for malignancy. Images were
processed with CAD.
IMPRESSION: No mammographic evidence of malignancy. A result letter of this
screening mammogram will be mailed directly to the patient.

RECOMMENDATION:
Screening mammogram in one year. (Code:[id number])

BI-RADS CATEGORY  1: Negative.

## 2023-02-10 ENCOUNTER — Other Ambulatory Visit: Payer: Self-pay | Admitting: Family Medicine

## 2023-02-10 ENCOUNTER — Other Ambulatory Visit: Payer: Self-pay | Admitting: Nurse Practitioner

## 2023-02-10 DIAGNOSIS — M810 Age-related osteoporosis without current pathological fracture: Secondary | ICD-10-CM

## 2023-03-18 ENCOUNTER — Ambulatory Visit
Admission: RE | Admit: 2023-03-18 | Discharge: 2023-03-18 | Disposition: A | Discharge status: Home / Self Care | Payer: Medicare PPO | Source: Ambulatory Visit | Attending: Nurse Practitioner | Admitting: Nurse Practitioner

## 2023-03-18 DIAGNOSIS — M810 Age-related osteoporosis without current pathological fracture: Secondary | ICD-10-CM

## 2023-04-16 ENCOUNTER — Ambulatory Visit: Payer: Medicare PPO | Admitting: Family Medicine

## 2023-04-16 ENCOUNTER — Encounter: Payer: Self-pay | Admitting: Family Medicine

## 2023-04-16 ENCOUNTER — Telehealth: Payer: Self-pay | Admitting: *Deleted

## 2023-04-16 VITALS — BP 120/70 | Ht 62.0 in | Wt 180.0 lb

## 2023-04-16 DIAGNOSIS — M810 Age-related osteoporosis without current pathological fracture: Secondary | ICD-10-CM

## 2023-04-16 NOTE — Telephone Encounter (Addendum)
Evenity VOB initiated via [email or web address]  Last OV: 04/16/23  Next OV:  Last Evenity inj: New Start Next Evenity inj DUE: 04/22/23 PA required: Yes.   PA can be initiated by calling [phone] or online at [email or web address]   COVERAGE DETAILS: Evenity and administration are subject to a $40.00 copay, which contributes to a $4,000.00 out of pocket max ($370.10 met). No deductible or coinsurance applies. The benefits provided on this Verification of Benefits form are the patient's In-Network benefits for Evenity.

## 2023-04-16 NOTE — Patient Instructions (Addendum)
We will get your labwork from Dr. Nickola Major to see if we need to do some additional labs. Take your calcium/vitamin D 1 tablet twice a day. We will look into Evenity coverage with your insurance to help build the bone back up - this is the once a month injection you would get here in the office for 1 year.
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
## 2023-04-18 ENCOUNTER — Other Ambulatory Visit: Payer: Self-pay | Admitting: *Deleted

## 2023-04-18 DIAGNOSIS — M810 Age-related osteoporosis without current pathological fracture: Secondary | ICD-10-CM

## 2023-04-21 NOTE — Telephone Encounter (Signed)
Evenity PA initiated via CoverMyMeds. Key: [id number]. Evenity PA is approved 04/21/23 to 09/30/2023. Ref #: [id number].

## 2023-04-23 ENCOUNTER — Ambulatory Visit: Payer: Medicare PPO | Admitting: Sports Medicine

## 2023-04-23 VITALS — Ht 62.0 in

## 2023-04-23 DIAGNOSIS — M810 Age-related osteoporosis without current pathological fracture: Secondary | ICD-10-CM | POA: Diagnosis not present

## 2023-04-23 MED ORDER — ROMOSOZUMAB-AQQG 105 MG/1.17ML ~~LOC~~ SOSY
210.0000 mg | PREFILLED_SYRINGE | Freq: Once | SUBCUTANEOUS | Status: AC
  Administered 2023-04-23: 210 mg via SUBCUTANEOUS

## 2023-04-23 NOTE — Progress Notes (Signed)
Patient is here for evenity injection #1. Patient received bilateral arm [place] evenity injections today. She tolerated injections well. Patient waited 15 mins to ensure no reaction to the medication. She will return in 1 month for her next evenity injection.

## 2023-04-25 LAB — PROTEIN ELECTROPHORESIS, SERUM

## 2023-05-26 ENCOUNTER — Ambulatory Visit: Payer: Medicare PPO | Admitting: Family Medicine

## 2023-05-26 DIAGNOSIS — M810 Age-related osteoporosis without current pathological fracture: Secondary | ICD-10-CM | POA: Diagnosis not present

## 2023-05-26 MED ORDER — ROMOSOZUMAB-AQQG 105 MG/1.17ML ~~LOC~~ SOSY
210.0000 mg | PREFILLED_SYRINGE | Freq: Once | SUBCUTANEOUS | Status: AC
  Administered 2023-05-26: 210 mg via SUBCUTANEOUS

## 2023-05-26 NOTE — Progress Notes (Signed)
 Patient is here for evenity injection #2. Patient received bilateral arm [place] evenity injections today. She tolerated injections well. She will return in 1 month for her next evenity injection.

## 2023-06-27 ENCOUNTER — Ambulatory Visit: Payer: Medicare PPO | Admitting: Family Medicine

## 2023-06-30 ENCOUNTER — Encounter: Payer: Self-pay | Admitting: Family Medicine

## 2023-06-30 ENCOUNTER — Ambulatory Visit: Payer: Medicare PPO | Admitting: Family Medicine

## 2023-06-30 DIAGNOSIS — M810 Age-related osteoporosis without current pathological fracture: Secondary | ICD-10-CM | POA: Diagnosis not present

## 2023-06-30 MED ORDER — ROMOSOZUMAB-AQQG 105 MG/1.17ML ~~LOC~~ SOSY
210.0000 mg | PREFILLED_SYRINGE | Freq: Once | SUBCUTANEOUS | Status: AC
  Administered 2023-06-30: 210 mg via SUBCUTANEOUS

## 2023-06-30 NOTE — Progress Notes (Signed)
Patient is here for evenity injection #3. Patient received bilateral arm [place] evenity injections today. She tolerated injections well. She will return in 1 month for her next evenity injection.   

## 2023-07-28 ENCOUNTER — Ambulatory Visit: Payer: Medicare PPO | Admitting: Family Medicine

## 2023-08-01 ENCOUNTER — Ambulatory Visit: Payer: Medicare PPO | Admitting: Family Medicine

## 2023-08-01 DIAGNOSIS — M810 Age-related osteoporosis without current pathological fracture: Secondary | ICD-10-CM

## 2023-08-01 MED ORDER — ROMOSOZUMAB-AQQG 105 MG/1.17ML ~~LOC~~ SOSY
210.0000 mg | PREFILLED_SYRINGE | Freq: Once | SUBCUTANEOUS | Status: AC
  Administered 2023-08-01: 210 mg via SUBCUTANEOUS

## 2023-08-01 NOTE — Progress Notes (Signed)
 Patient is here for evenity injection #4. Patient received bilateral arm [place] evenity injections today. She tolerated injections well. She will return in 1 month for her next evenity injection

## 2023-08-02 NOTE — Telephone Encounter (Signed)
Evenity Injection Schedule Inj #1 - 04/23/23 Inj #2 - 05/26/23 Inj #3 - 06/30/23 Inj #4 - 08/01/23  Prior Auth EXP 09/30/23  Inj #5 - scheduled 09/02/23 Inj #6  Inj #7  Inj #8  Inj #9  Inj #10  Inj #11  Inj #12

## 2023-08-05 NOTE — Telephone Encounter (Signed)
Evenity PA approved: 10/01/23 to 09/29/24.  [employer] Ref ID: [id number].

## 2023-09-01 ENCOUNTER — Ambulatory Visit: Payer: Medicare PPO | Admitting: Family Medicine

## 2023-09-02 ENCOUNTER — Ambulatory Visit: Payer: Medicare PPO | Admitting: Family Medicine

## 2023-09-02 ENCOUNTER — Telehealth: Payer: Self-pay | Admitting: *Deleted

## 2023-09-02 DIAGNOSIS — M810 Age-related osteoporosis without current pathological fracture: Secondary | ICD-10-CM | POA: Diagnosis not present

## 2023-09-02 MED ORDER — ROMOSOZUMAB-AQQG 105 MG/1.17ML ~~LOC~~ SOSY
210.0000 mg | PREFILLED_SYRINGE | Freq: Once | SUBCUTANEOUS | Status: AC
  Administered 2023-09-02: 210 mg via SUBCUTANEOUS

## 2023-09-02 NOTE — Progress Notes (Signed)
Patient is here for evenity injection #5. Patient received bilateral arm [place] evenity injections today. She will return in 1 month for her next evenity injection.  Below is a copy of her authorization for the remainder of her injections into 2025:

## 2023-09-11 NOTE — Telephone Encounter (Signed)
Evenity Injection Schedule Inj #1 - 04/23/23 Inj #2 - 05/26/23 Inj #3 - 06/30/23 Inj #4 - 08/01/23   Prior Auth EXP 09/30/23  PA renewal PA# [id number]  Valid 10/01/23-09/29/24   Inj #5 - 09/02/23 Inj #6 - scheduled 10/07/23 Inj #7 - scheduled 11/11/23 Inj #8  Inj #9  Inj #10  Inj #11  Inj #12

## 2023-10-07 ENCOUNTER — Ambulatory Visit: Payer: Medicare PPO | Admitting: Family Medicine

## 2023-10-07 DIAGNOSIS — M810 Age-related osteoporosis without current pathological fracture: Secondary | ICD-10-CM

## 2023-10-07 MED ORDER — ROMOSOZUMAB-AQQG 105 MG/1.17ML ~~LOC~~ SOSY
210.0000 mg | PREFILLED_SYRINGE | Freq: Once | SUBCUTANEOUS | Status: AC
  Administered 2023-10-07: 210 mg via SUBCUTANEOUS

## 2023-10-07 NOTE — Progress Notes (Signed)
 Patient is here for evenity injection #6. Patient received bilateral arm [place] evenity injections today. She will return in 1 month for her next evenity injection

## 2023-10-08 NOTE — Telephone Encounter (Signed)
 Medical Buy and Bill  Prior Authorization REQUIRED  PA PROCESS DETAILS: PA is required. PA can be initiated by calling [phone] or online at [email or web address].

## 2023-10-08 NOTE — Telephone Encounter (Signed)
 Prior Authorization initiated for EVENITY via [email or web address] KEY: [id number]

## 2023-10-11 NOTE — Telephone Encounter (Signed)
 Medical Buy and [place]te Stable  Prior Authorization for [employer] APPROVED PA# [id number] Valid: 04/21/23-09/29/24

## 2023-10-11 NOTE — Telephone Encounter (Addendum)
 Evenity Injection Schedule Inj #1 - 04/23/23 Inj #2 - 05/26/23 Inj #3 - 06/30/23 Inj #4 - 08/01/23   Prior Auth EXP 09/30/23   PA renewal PA# [id number] Valid: 04/21/23-09/29/24   Inj #5 - 09/02/23 Inj #6 - 10/07/23 Inj #7 - 11/11/23 Inj #8 - schedule

## 2023-11-11 ENCOUNTER — Ambulatory Visit: Payer: Medicare PPO | Admitting: Family Medicine

## 2023-11-11 DIAGNOSIS — M810 Age-related osteoporosis without current pathological fracture: Secondary | ICD-10-CM | POA: Diagnosis not present

## 2023-11-11 MED ORDER — ROMOSOZUMAB-AQQG 105 MG/1.17ML ~~LOC~~ SOSY
210.0000 mg | PREFILLED_SYRINGE | Freq: Once | SUBCUTANEOUS | Status: AC
  Administered 2023-11-11: 210 mg via SUBCUTANEOUS

## 2023-11-11 NOTE — Progress Notes (Unsigned)
Patient is here for evenity injection #7. Patient received bilateral arm [place] evenity injections today. She tolerated injections well. She will return in 1 month for her next evenity injection.

## 2023-12-10 ENCOUNTER — Ambulatory Visit: Payer: Medicare PPO | Admitting: Family Medicine

## 2023-12-10 DIAGNOSIS — M810 Age-related osteoporosis without current pathological fracture: Secondary | ICD-10-CM

## 2023-12-10 MED ORDER — ROMOSOZUMAB-AQQG 105 MG/1.17ML ~~LOC~~ SOSY
210.0000 mg | PREFILLED_SYRINGE | Freq: Once | SUBCUTANEOUS | Status: AC
  Administered 2023-12-10: 210 mg via SUBCUTANEOUS

## 2023-12-10 NOTE — Progress Notes (Signed)
Patient is here for evenity injection #8. Patient received bilateral arm [place] evenity injections today. She tolerated injections well. She will return in 1 month for her next evenity injection.

## 2024-01-12 ENCOUNTER — Ambulatory Visit: Admitting: Family Medicine

## 2024-01-12 VITALS — Ht 62.0 in

## 2024-01-12 DIAGNOSIS — M810 Age-related osteoporosis without current pathological fracture: Secondary | ICD-10-CM | POA: Diagnosis not present

## 2024-01-12 MED ORDER — ROMOSOZUMAB-AQQG 105 MG/1.17ML ~~LOC~~ SOSY
210.0000 mg | PREFILLED_SYRINGE | Freq: Once | SUBCUTANEOUS | Status: AC
  Administered 2024-01-12: 210 mg via SUBCUTANEOUS

## 2024-01-12 NOTE — Progress Notes (Signed)
 Patient is here for evenity injection #9. Patient received bilateral arm [place] evenity injections today. She tolerated injections well. She will return in 1 month for her next evenity injection.

## 2024-02-12 ENCOUNTER — Ambulatory Visit: Admitting: Family Medicine

## 2024-02-12 DIAGNOSIS — M810 Age-related osteoporosis without current pathological fracture: Secondary | ICD-10-CM | POA: Diagnosis not present

## 2024-02-12 MED ORDER — ROMOSOZUMAB-AQQG 105 MG/1.17ML ~~LOC~~ SOSY
210.0000 mg | PREFILLED_SYRINGE | Freq: Once | SUBCUTANEOUS | Status: AC
  Administered 2024-02-12: 210 mg via SUBCUTANEOUS

## 2024-02-12 NOTE — Progress Notes (Signed)
 Patient is here for evenity injection #10. Patient received bilateral arm [place] evenity injections today. She tolerated injections well. She will return in 1 month for her next evenity injection

## 2024-03-15 ENCOUNTER — Ambulatory Visit: Admitting: Family Medicine

## 2024-03-15 DIAGNOSIS — M810 Age-related osteoporosis without current pathological fracture: Secondary | ICD-10-CM

## 2024-03-15 MED ORDER — ROMOSOZUMAB-AQQG 105 MG/1.17ML ~~LOC~~ SOSY
210.0000 mg | PREFILLED_SYRINGE | Freq: Once | SUBCUTANEOUS | Status: AC
  Administered 2024-03-15: 210 mg via SUBCUTANEOUS

## 2024-03-15 NOTE — Progress Notes (Signed)
 Patient is here for evenity injection #11. Patient received bilateral arm [place] evenity injections today. She tolerated injections well. She will return in 1 month for her next evenity injection.

## 2024-04-15 ENCOUNTER — Ambulatory Visit: Admitting: Family Medicine

## 2024-04-15 DIAGNOSIS — M810 Age-related osteoporosis without current pathological fracture: Secondary | ICD-10-CM

## 2024-04-15 MED ORDER — ROMOSOZUMAB-AQQG 105 MG/1.17ML ~~LOC~~ SOSY
210.0000 mg | PREFILLED_SYRINGE | Freq: Once | SUBCUTANEOUS | Status: AC
  Administered 2024-04-15: 210 mg via SUBCUTANEOUS

## 2024-04-15 NOTE — Patient Instructions (Addendum)
 [facility] [address], [place], [place] [address] Phone: [phone]

## 2024-04-15 NOTE — Progress Notes (Unsigned)
 Patient is here for evenity  injection #12. Patient received bilateral arm [place] evenity  injections today. She tolerated injections well. She will return in 1 month to discuss next steps with Dr Cleatrice.  She will also sched her BMD before her appt with Dr Cleatrice.

## 2024-04-20 ENCOUNTER — Ambulatory Visit (HOSPITAL_BASED_OUTPATIENT_CLINIC_OR_DEPARTMENT_OTHER)
Admission: RE | Admit: 2024-04-20 | Discharge: 2024-04-20 | Disposition: A | Discharge status: Home / Self Care | Source: Ambulatory Visit | Attending: Family Medicine | Admitting: Family Medicine

## 2024-04-20 DIAGNOSIS — M810 Age-related osteoporosis without current pathological fracture: Secondary | ICD-10-CM | POA: Insufficient documentation

## 2024-04-21 ENCOUNTER — Ambulatory Visit: Payer: Self-pay | Admitting: Family Medicine

## 2024-05-17 ENCOUNTER — Ambulatory Visit: Admitting: Family Medicine

## 2024-05-17 ENCOUNTER — Other Ambulatory Visit: Payer: Self-pay

## 2024-05-17 ENCOUNTER — Encounter (HOSPITAL_COMMUNITY): Payer: Self-pay

## 2024-05-17 ENCOUNTER — Telehealth: Payer: Self-pay | Admitting: *Deleted

## 2024-05-17 VITALS — BP 128/76 | Ht 62.0 in | Wt 180.0 lb

## 2024-05-17 DIAGNOSIS — M810 Age-related osteoporosis without current pathological fracture: Secondary | ICD-10-CM

## 2024-05-17 MED ORDER — DENOSUMAB 60 MG/ML ~~LOC~~ SOSY
60.0000 mg | PREFILLED_SYRINGE | Freq: Once | SUBCUTANEOUS | 0 refills | Status: AC
  Filled 2024-05-17: qty 1, 1d supply, fill #0
  Filled 2024-05-26: qty 1, 180d supply, fill #0

## 2024-05-17 NOTE — Progress Notes (Signed)
 Pharmacy Patient Advocate Encounter  Insurance verification completed.   The patient is insured through [employer]   Ran test claim for Prolia. Co-pay is $64.  This test claim was processed through [facility]- copay amounts may vary at other pharmacies due to pharmacy/plan contracts, or as the patient moves through the different stages of their insurance plan.

## 2024-05-17 NOTE — Telephone Encounter (Signed)
 Medical Buy and [place]  Patient is ready for scheduling on or after: 05/18/24  Out-of-pocket cost due at time of visit: $375.84  Primary: [employer] Prolia  co-insurance: 20% (approximately $350.84) Admin fee co-insurance: 20% (approximately $25)  Deductible: n/a  Prior Auth: Approved PA #: [id number] Valid: 05/18/24 - 09/29/24   ** This summary of benefits is an estimation of the patient's out-of-pocket cost. Exact cost may vary based on individual plan coverage.

## 2024-05-17 NOTE — Patient Instructions (Signed)
 We will look into prolia  for you. When you get labs drawn make sure they check your calcium and vitamin D  and have them sent to us .
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
## 2024-05-18 ENCOUNTER — Other Ambulatory Visit: Payer: Self-pay

## 2024-05-19 ENCOUNTER — Other Ambulatory Visit: Payer: Self-pay

## 2024-05-20 ENCOUNTER — Other Ambulatory Visit: Payer: Self-pay

## 2024-05-24 ENCOUNTER — Other Ambulatory Visit: Payer: Self-pay

## 2024-05-26 ENCOUNTER — Other Ambulatory Visit: Payer: Self-pay | Admitting: *Deleted

## 2024-05-26 ENCOUNTER — Other Ambulatory Visit: Payer: Self-pay

## 2024-05-26 DIAGNOSIS — M810 Age-related osteoporosis without current pathological fracture: Secondary | ICD-10-CM

## 2024-05-26 NOTE — Progress Notes (Signed)
 Specialty Pharmacy Initial Fill Coordination Note  Judy Waters is a 78 y.o. female contacted today regarding initial fill of specialty medication(s) Denosumab  (PROLIA )   Patient requested Courier to Provider Office   Delivery date: 05/27/24   Verified address: [address] [place] [address]   Medication will be filled on 8/27.   Patient is aware of $64 copayment.

## 2024-05-27 ENCOUNTER — Ambulatory Visit: Payer: Self-pay | Admitting: Family Medicine

## 2024-05-27 LAB — BASIC METABOLIC PANEL WITH GFR
BUN/Creatinine Ratio: 21 (ref 12–28)
BUN: 16 mg/dL (ref 8–27)
CO2: 23 mmol/L (ref 20–29)
Calcium: 10.3 mg/dL (ref 8.7–10.3)
Chloride: 104 mmol/L (ref 96–106)
Creatinine, Ser: 0.75 mg/dL (ref 0.57–1.00)
Glucose: 86 mg/dL (ref 70–99)
Potassium: 4.6 mmol/L (ref 3.5–5.2)
Sodium: 141 mmol/L (ref 134–144)
eGFR: 81 mL/min/1.73 (ref >59)

## 2024-05-27 LAB — VITAMIN D 25 HYDROXY (VIT D DEFICIENCY, FRACTURES): Vit D, 25-Hydroxy: 53.5 ng/mL (ref 30.0–100.0)

## 2024-06-08 ENCOUNTER — Ambulatory Visit (INDEPENDENT_AMBULATORY_CARE_PROVIDER_SITE_OTHER): Admitting: Family Medicine

## 2024-06-08 DIAGNOSIS — M810 Age-related osteoporosis without current pathological fracture: Secondary | ICD-10-CM | POA: Diagnosis not present

## 2024-06-08 MED ORDER — DENOSUMAB 60 MG/ML ~~LOC~~ SOSY
60.0000 mg | PREFILLED_SYRINGE | Freq: Once | SUBCUTANEOUS | Status: AC
  Administered 2024-06-08: 60 mg via SUBCUTANEOUS

## 2024-06-08 NOTE — Progress Notes (Signed)
 Patient given [place] prolia injection 60mg /ml in the left arm. Patient tolerated injection well without reaction at the injection site. Patient will schedule next injection, which is 6 months from today.

## 2024-11-16 ENCOUNTER — Other Ambulatory Visit (HOSPITAL_BASED_OUTPATIENT_CLINIC_OR_DEPARTMENT_OTHER)

## 2024-12-07 ENCOUNTER — Ambulatory Visit: Admitting: Family Medicine
